# Patient Record
Sex: Male | Born: 1995 | Race: White | Hispanic: No | Marital: Single | Smoking: Never smoker
Health system: Southern US, Community
[De-identification: ages and names within clinical notes are randomized; demographics above are authoritative.]

## PROBLEM LIST (undated history)

## (undated) DIAGNOSIS — J019 Acute sinusitis, unspecified: Secondary | ICD-10-CM

## (undated) DIAGNOSIS — Z0000 Encounter for general adult medical examination without abnormal findings: Secondary | ICD-10-CM

## (undated) DIAGNOSIS — L0591 Pilonidal cyst without abscess: Secondary | ICD-10-CM

## (undated) DIAGNOSIS — S060X9A Concussion with loss of consciousness of unspecified duration, initial encounter: Secondary | ICD-10-CM

## (undated) HISTORY — DX: Concussion with loss of consciousness of unspecified duration, initial encounter: S06.0X9A

## (undated) HISTORY — PX: WISDOM TOOTH EXTRACTION: SHX21

## (undated) HISTORY — DX: Pilonidal cyst without abscess: L05.91

---
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
## 2013-01-07 HISTORY — PX: PILONIDAL CYST EXCISION: SHX744

## 2013-04-24 ENCOUNTER — Ambulatory Visit: Payer: Self-pay | Admitting: Family Medicine

## 2013-05-17 ENCOUNTER — Ambulatory Visit: Payer: Self-pay | Admitting: Family Medicine

## 2013-05-24 ENCOUNTER — Encounter: Payer: Self-pay | Admitting: General Surgery

## 2013-05-24 ENCOUNTER — Ambulatory Visit (INDEPENDENT_AMBULATORY_CARE_PROVIDER_SITE_OTHER): Payer: 59 | Admitting: General Surgery

## 2013-05-24 VITALS — BP 120/80 | Ht 77.0 in | Wt 235.0 lb

## 2013-05-24 DIAGNOSIS — L0501 Pilonidal cyst with abscess: Secondary | ICD-10-CM

## 2013-05-24 DIAGNOSIS — L0591 Pilonidal cyst without abscess: Secondary | ICD-10-CM

## 2013-05-24 NOTE — Progress Notes (Signed)
Patient ID: Jake Dixon, male   DOB: February 03, 1996, 17 y.o.   MRN: [id number]  Chief Complaint  Patient presents with  . Other    pilonidal cyst    HPI Jake Dixon is a 17 y.o. male here today for an pilonidal cyst. Patient states it has been there for about about 1 year. Patient had it drained in November 2013 and surgery in March  2014. Patient states there is an new one under the old one. 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  HPI  Past Medical History  Diagnosis Date  . Pilonidal cyst 2014  . Concussion 2012     Past Surgical History  Procedure Laterality Date  . Wisdom tooth extraction    . Pilonidal cyst drainage  09/2012  . Pilonidal cyst excision  01/2013    No family history on file.  Social History History  Substance Use Topics  . Smoking status: Never Smoker   . Smokeless tobacco: Never Used  . Alcohol Use: No    No Known Allergies  No current outpatient prescriptions on file.   No current facility-administered medications for this visit.    Review of Systems Review of Systems  Constitutional: Negative.   Respiratory: Negative.   Cardiovascular: Negative.     Blood pressure 120/80, height 6\' 5"  (1.956 m), weight 235 lb (106.595 kg).  Physical Exam Physical Exam  Constitutional: He is oriented to person, place, and time. He appears well-developed and well-nourished.  Cardiovascular: Normal rate, regular rhythm and normal heart sounds.   Pulmonary/Chest: Breath sounds normal.  Genitourinary:  Well healed scar in the midportion of the natal cleft. Fairly dense scarring here. A second incision to the right of the midline and superior to the natal cleft lesion is noted from his spring 2014 exploration. There is a 0.5 cm opening below the surgical site. No erythema is identified.  Neurological: He is alert and oriented to person, place, and time.  Skin: Skin is warm and dry.     Data Reviewed No data to review.  Assessment    Recurrent pilonidal abscess.    Plan    There appears to be fairly wide drainage and no evidence of loculated purulence. He'll likely require repeat excision. He is minimally symptomatic at present, and with his workouts for football, he may decline surgery at this time and wait until he returns home for Christmas break. He has been encouraged to get clippers to remove the generous layer of hair from the area around the natal cleft to help minimize local irritation.       Jake Dixon 05/24/2013, 10:52 PM

## 2014-01-03 ENCOUNTER — Ambulatory Visit: Payer: Self-pay

## 2014-01-03 ENCOUNTER — Other Ambulatory Visit: Payer: Self-pay

## 2014-04-26 ENCOUNTER — Ambulatory Visit: Payer: Self-pay | Admitting: Family Medicine

## 2014-07-23 ENCOUNTER — Ambulatory Visit: Payer: Self-pay | Admitting: Family Medicine

## 2015-02-08 ENCOUNTER — Ambulatory Visit: Admit: 2015-02-08 | Disposition: A | Discharge status: Home / Self Care | Payer: Self-pay | Admitting: Family Medicine

## 2015-06-19 ENCOUNTER — Ambulatory Visit: Payer: 59

## 2015-06-19 ENCOUNTER — Ambulatory Visit
Admission: EM | Admit: 2015-06-19 | Discharge: 2015-06-19 | Disposition: A | Discharge status: Home / Self Care | Payer: 59 | Attending: Family Medicine | Admitting: Family Medicine

## 2015-06-19 DIAGNOSIS — T672XXS Heat cramp, sequela: Secondary | ICD-10-CM

## 2015-06-19 DIAGNOSIS — R252 Cramp and spasm: Secondary | ICD-10-CM | POA: Insufficient documentation

## 2015-06-19 DIAGNOSIS — T672XXD Heat cramp, subsequent encounter: Secondary | ICD-10-CM

## 2015-06-19 LAB — CBC WITH DIFFERENTIAL/PLATELET
Basophils Absolute: 0 10*3/uL (ref 0–0.1)
Basophils Relative: 1 %
Eosinophils Absolute: 0.1 10*3/uL (ref 0–0.7)
Eosinophils Relative: 2 %
HCT: 46.8 % (ref 40.0–52.0)
Hemoglobin: 16.2 g/dL (ref 13.0–18.0)
LYMPHS PCT: 33 %
Lymphs Abs: 2.6 10*3/uL (ref 1.0–3.6)
MCH: 28.6 pg (ref 26.0–34.0)
MCHC: 34.7 g/dL (ref 32.0–36.0)
MCV: 82.5 fL (ref 80.0–100.0)
Monocytes Absolute: 0.7 10*3/uL (ref 0.2–1.0)
Monocytes Relative: 9 %
NEUTROS PCT: 55 %
Neutro Abs: 4.4 10*3/uL (ref 1.4–6.5)
Platelets: 184 10*3/uL (ref 150–440)
RBC: 5.68 MIL/uL (ref 4.40–5.90)
RDW: 12.6 % (ref 11.5–14.5)
WBC: 7.9 10*3/uL (ref 3.8–10.6)

## 2015-06-19 LAB — COMPREHENSIVE METABOLIC PANEL
ALBUMIN: 5.4 g/dL — AB (ref 3.5–5.0)
ALT: 54 U/L (ref 17–63)
AST: 59 U/L — AB (ref 15–41)
Alkaline Phosphatase: 67 U/L (ref 38–126)
Anion gap: 11 (ref 5–15)
BILIRUBIN TOTAL: 1 mg/dL (ref 0.3–1.2)
BUN: 23 mg/dL — ABNORMAL HIGH (ref 6–20)
CALCIUM: 9.9 mg/dL (ref 8.9–10.3)
CO2: 24 mmol/L (ref 22–32)
Chloride: 100 mmol/L — ABNORMAL LOW (ref 101–111)
Creatinine, Ser: 1.12 mg/dL (ref 0.61–1.24)
GFR calc Af Amer: >60 mL/min (ref >60)
GFR calc non Af Amer: >60 mL/min (ref >60)
Glucose, Bld: 99 mg/dL (ref 65–99)
Potassium: 4.3 mmol/L (ref 3.5–5.1)
SODIUM: 135 mmol/L (ref 135–145)
Total Protein: 8.8 g/dL — ABNORMAL HIGH (ref 6.5–8.1)

## 2015-06-19 LAB — URINALYSIS COMPLETE WITH MICROSCOPIC (ARMC ONLY)
BILIRUBIN URINE: NEGATIVE
Glucose, UA: NEGATIVE mg/dL
KETONES UR: NEGATIVE mg/dL
Leukocytes, UA: NEGATIVE
Nitrite: NEGATIVE
PROTEIN: NEGATIVE mg/dL
RBC / HPF: NONE SEEN RBC/hpf (ref <3)
SPECIFIC GRAVITY, URINE: 1.01 (ref 1.005–1.030)
pH: 6.5 (ref 5.0–8.0)

## 2015-06-19 LAB — MONONUCLEOSIS SCREEN: MONO SCREEN: NEGATIVE

## 2015-06-19 NOTE — ED Provider Notes (Signed)
Patient presents today for follow-up regarding heat-related full body cramps at football practice yesterday. Patient was given 1 L of normal saline IV in the training room by me. Patient states that the cramps have resolved since getting the IV fluids. He has however had decreased appetite over the last week or 2 and has also had a mild productive cough and fatigue. He had noticed some night sweats for a few nights but did not have them yesterday. He denies any headache, chest pain, shortness of breath, abdominal pain, diarrhea, vomiting, rash. He denies any history of any tick bites. He denies any new medication use or supplement use. He denies any alcohol or illicit drug use. He denies any history of heat related issues in the past playing sports.  ROS: Negative except mentioned above. Vitals as per Epic  GENERAL: NAD HEENT: no pharyngeal erythema, no exudate, no erythema of TMs, no cervical LAD RESP: CTA B CARD: RRR ABD: +BS, NT/ND, no rebound or guarding NEURO: CN II-XII grossly intact   A/P: Heat Related Cramps/Exhaustion-patient seems to be improving from this, has slight elevation of creatinine which should continue to normalize with oral hydration, chest x-ray shows no acute process, CBC normal, mono negative, there is slight elevation of total protein albumin and liver function tests which could represent possible viral process resolving, would like to repeat this in 1 week to see if it has normalized. Patient is to follow-up with me at [facility] later this week. He is not to practice today and is to check in with his trainer tomorrow to see if his weight is maintained and to see if he is well enough to practice. Discussed the dangers of heat related illness. Patient addresses understanding of above.   Jolene Provost, MD 06/19/15 [phone]

## 2015-06-19 NOTE — ED Notes (Signed)
Pt states "I am student at [employer], yesterday I developed a full body cramp. The team doctor gave me IV fluids, the cramps have resolved, but I feel weak and no appetite. I also have a productive cough. No sore throat. I have had night sweats."

## 2015-08-30 DIAGNOSIS — F320 Major depressive disorder, single episode, mild: Secondary | ICD-10-CM | POA: Diagnosis not present

## 2015-09-20 DIAGNOSIS — F329 Major depressive disorder, single episode, unspecified: Secondary | ICD-10-CM | POA: Diagnosis not present

## 2015-09-20 DIAGNOSIS — Z7141 Alcohol abuse counseling and surveillance of alcoholic: Secondary | ICD-10-CM | POA: Diagnosis not present

## 2015-09-20 DIAGNOSIS — S060X9A Concussion with loss of consciousness of unspecified duration, initial encounter: Secondary | ICD-10-CM | POA: Diagnosis not present

## 2016-03-09 ENCOUNTER — Other Ambulatory Visit: Payer: Self-pay | Admitting: Family Medicine

## 2016-03-09 ENCOUNTER — Ambulatory Visit
Admission: RE | Admit: 2016-03-09 | Discharge: 2016-03-09 | Disposition: A | Discharge status: Home / Self Care | Payer: 59 | Source: Ambulatory Visit | Attending: Family Medicine | Admitting: Family Medicine

## 2016-03-09 DIAGNOSIS — M25561 Pain in right knee: Secondary | ICD-10-CM | POA: Diagnosis present

## 2016-03-09 DIAGNOSIS — M25461 Effusion, right knee: Secondary | ICD-10-CM

## 2016-03-09 DIAGNOSIS — Y9361 Activity, american tackle football: Secondary | ICD-10-CM | POA: Diagnosis not present

## 2016-03-12 ENCOUNTER — Ambulatory Visit
Admission: RE | Admit: 2016-03-12 | Discharge: 2016-03-12 | Disposition: A | Discharge status: Home / Self Care | Payer: 59 | Source: Ambulatory Visit | Attending: Family Medicine | Admitting: Family Medicine

## 2016-03-12 ENCOUNTER — Other Ambulatory Visit: Payer: Self-pay | Admitting: Family Medicine

## 2016-03-12 DIAGNOSIS — S83411A Sprain of medial collateral ligament of right knee, initial encounter: Secondary | ICD-10-CM | POA: Diagnosis not present

## 2016-03-12 DIAGNOSIS — S8991XA Unspecified injury of right lower leg, initial encounter: Secondary | ICD-10-CM

## 2016-03-12 DIAGNOSIS — M2341 Loose body in knee, right knee: Secondary | ICD-10-CM | POA: Diagnosis not present

## 2016-03-12 DIAGNOSIS — M25461 Effusion, right knee: Secondary | ICD-10-CM | POA: Diagnosis present

## 2016-03-12 DIAGNOSIS — S83014A Lateral dislocation of right patella, initial encounter: Secondary | ICD-10-CM | POA: Diagnosis not present

## 2016-03-13 ENCOUNTER — Ambulatory Visit: Payer: 59

## 2016-04-23 ENCOUNTER — Other Ambulatory Visit: Payer: Self-pay | Admitting: Family Medicine

## 2016-04-23 ENCOUNTER — Ambulatory Visit (INDEPENDENT_AMBULATORY_CARE_PROVIDER_SITE_OTHER): Payer: 59 | Admitting: Family Medicine

## 2016-04-23 DIAGNOSIS — Z0283 Encounter for blood-alcohol and blood-drug test: Secondary | ICD-10-CM

## 2016-04-23 NOTE — Progress Notes (Signed)
Patient ID: Jake BersJohn L Dixon, male   DOB: 11/19/1995, 20 y.o.   MRN: [id number]  Patient presents today for drug screen. 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 Patient denies any substance abuse since testing positive. 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 He denies any suicidal ideations. He denies smoking cigarettes or alcohol use.  ROS: Negative except mentioned above.  Exam-deferred  A/P: Drug Screen- will send patient to [facility] for drug screen.

## 2016-04-24 LAB — 5+CRT-BUND
AMPHETAMINES, URINE: NEGATIVE ng/mL
CANNABINOID QUANT UR: NEGATIVE ng/mL
Cocaine (Metab.): NEGATIVE ng/mL
Creatinine, Urine: 352.7 mg/dL — ABNORMAL HIGH (ref 20.0–300.0)
OPIATE QUANTITATIVE URINE: NEGATIVE ng/mL
PCP QUANT UR: NEGATIVE ng/mL
pH, Urine: 5.9 (ref 4.5–8.9)

## 2016-05-18 ENCOUNTER — Ambulatory Visit (INDEPENDENT_AMBULATORY_CARE_PROVIDER_SITE_OTHER): Payer: 59 | Admitting: Family Medicine

## 2016-05-18 ENCOUNTER — Encounter: Payer: Self-pay | Admitting: Family Medicine

## 2016-05-18 VITALS — BP 121/77 | HR 76 | Temp 98.1°F | Resp 16

## 2016-05-18 DIAGNOSIS — R109 Unspecified abdominal pain: Secondary | ICD-10-CM

## 2016-05-18 DIAGNOSIS — R197 Diarrhea, unspecified: Secondary | ICD-10-CM

## 2016-05-18 MED ORDER — DICYCLOMINE HCL 20 MG PO TABS: 20.0000 mg | ORAL_TABLET | Freq: Three times a day (TID) | ORAL | Status: AC

## 2016-05-18 NOTE — Progress Notes (Addendum)
Patient ID: Jake Dixon, male   DOB: 05/01/1996, 20 y.o.   MRN: [id number]  Patient presents today with symptoms of abdominal cramping and diarrhea. Patient states the symptoms started on Saturday. He states that he had biscuit Bell in the morning and a BBQ pizza in the afternoon. He woke up in the [place] night with the symptoms. He denies any nausea or vomiting or any chest pain or shortness of breath. He denies any fever. He took Pepto-Bismol which helped some but then symptoms came back. He denies any bloody diarrhea or any urinary symptoms. He denies any alcohol use or any illicit drug use. Patient denied any recent antibiotic use.  ROS: Negative except mentioned above.  Vitals as per Epic GENERAL: NAD HEENT: no pharyngeal erythema, no exudate, no erythema of TMs, no cervical LAD RESP: CTA B CARD: RRR ABD: overactive BS, NT, no rebound or guarding, no flank tenderness NEURO: CN II-XII grossly intact   A/P: Gastroenteritis, Abdominal Cramping- rest, hydration, no athletic activity until diarrhea resolves, will treat with Bentyl when necessary, BRAT diet discussed, seek medical attention if symptoms persist or worsen as discussed.

## 2018-03-29 IMAGING — CR DG KNEE COMPLETE 4+V*R*
1 series · 4 of 4 positions shown · non-contrast
Comparison: None.

CLINICAL DATA: Football injury from 2 days ago with persistent knee
pain, initial encounter

EXAM:
RIGHT KNEE - COMPLETE 4+ VIEW

[Series 1: view not recorded · 0.14mm/px · 4 of 4 slices shown]
[im 1/4]
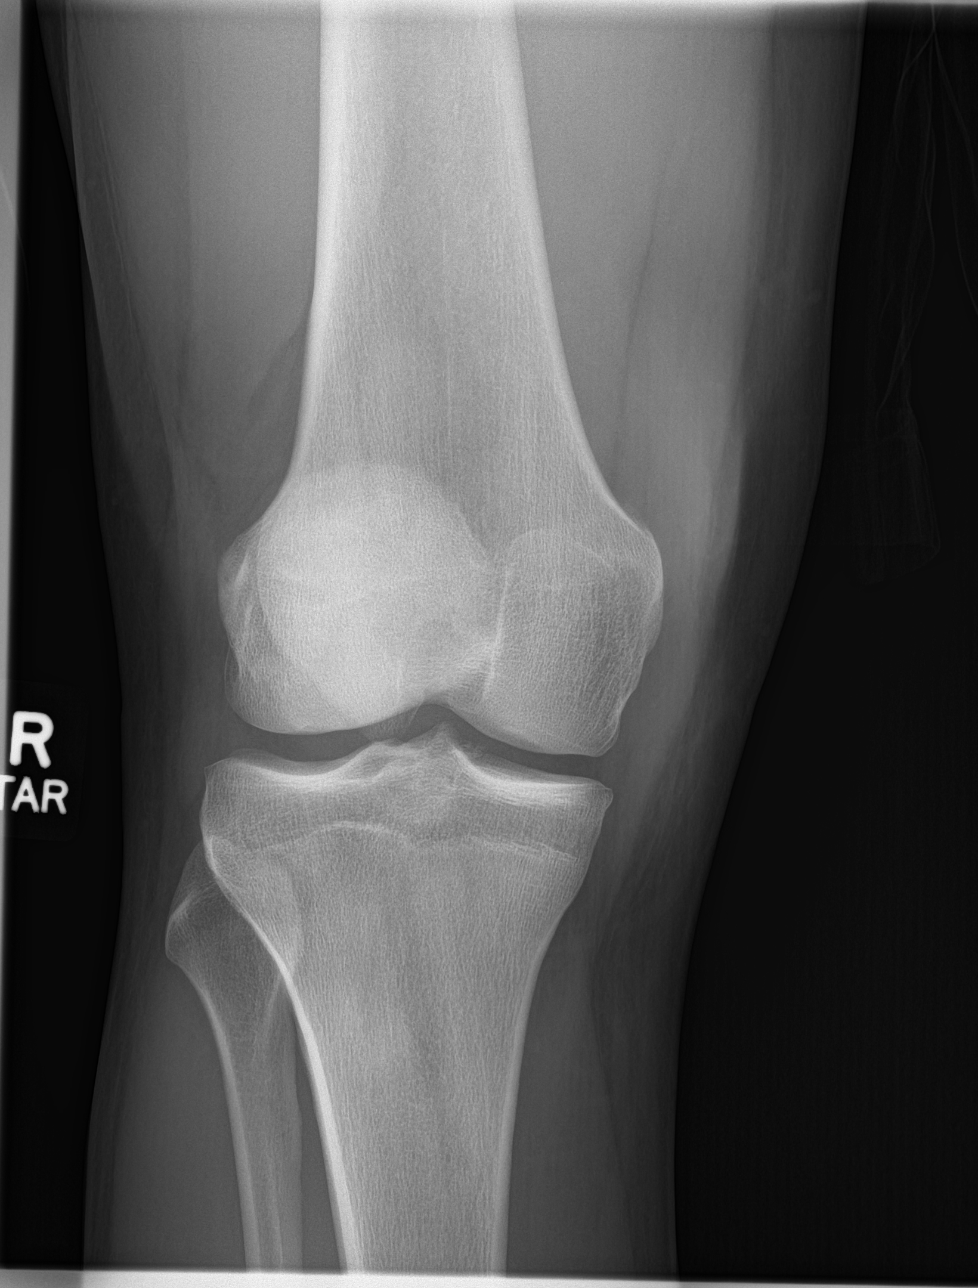
[im 2/4]
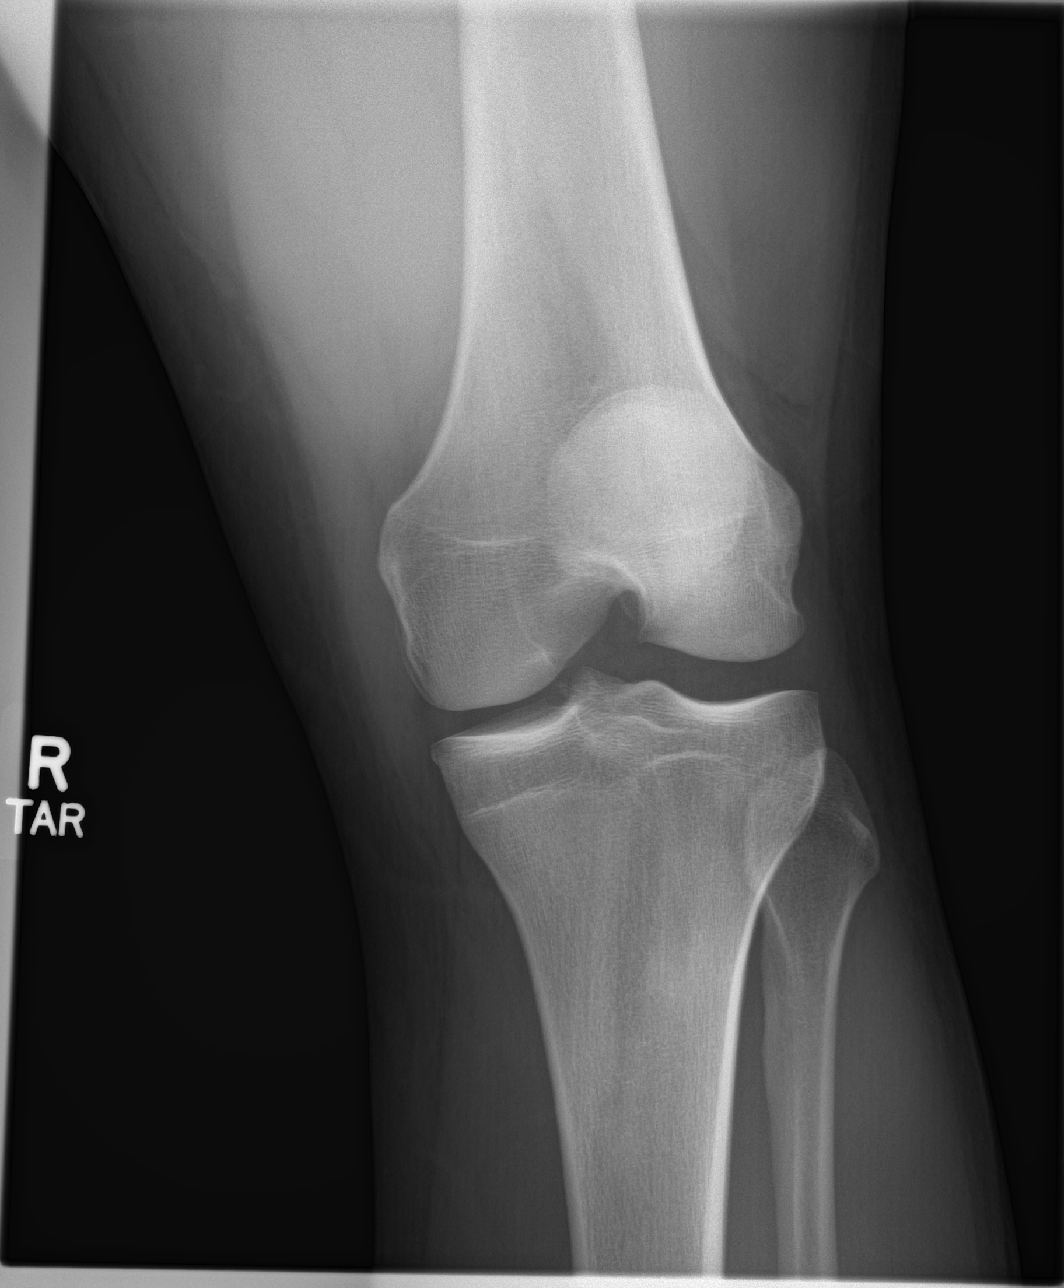
[im 3/4]
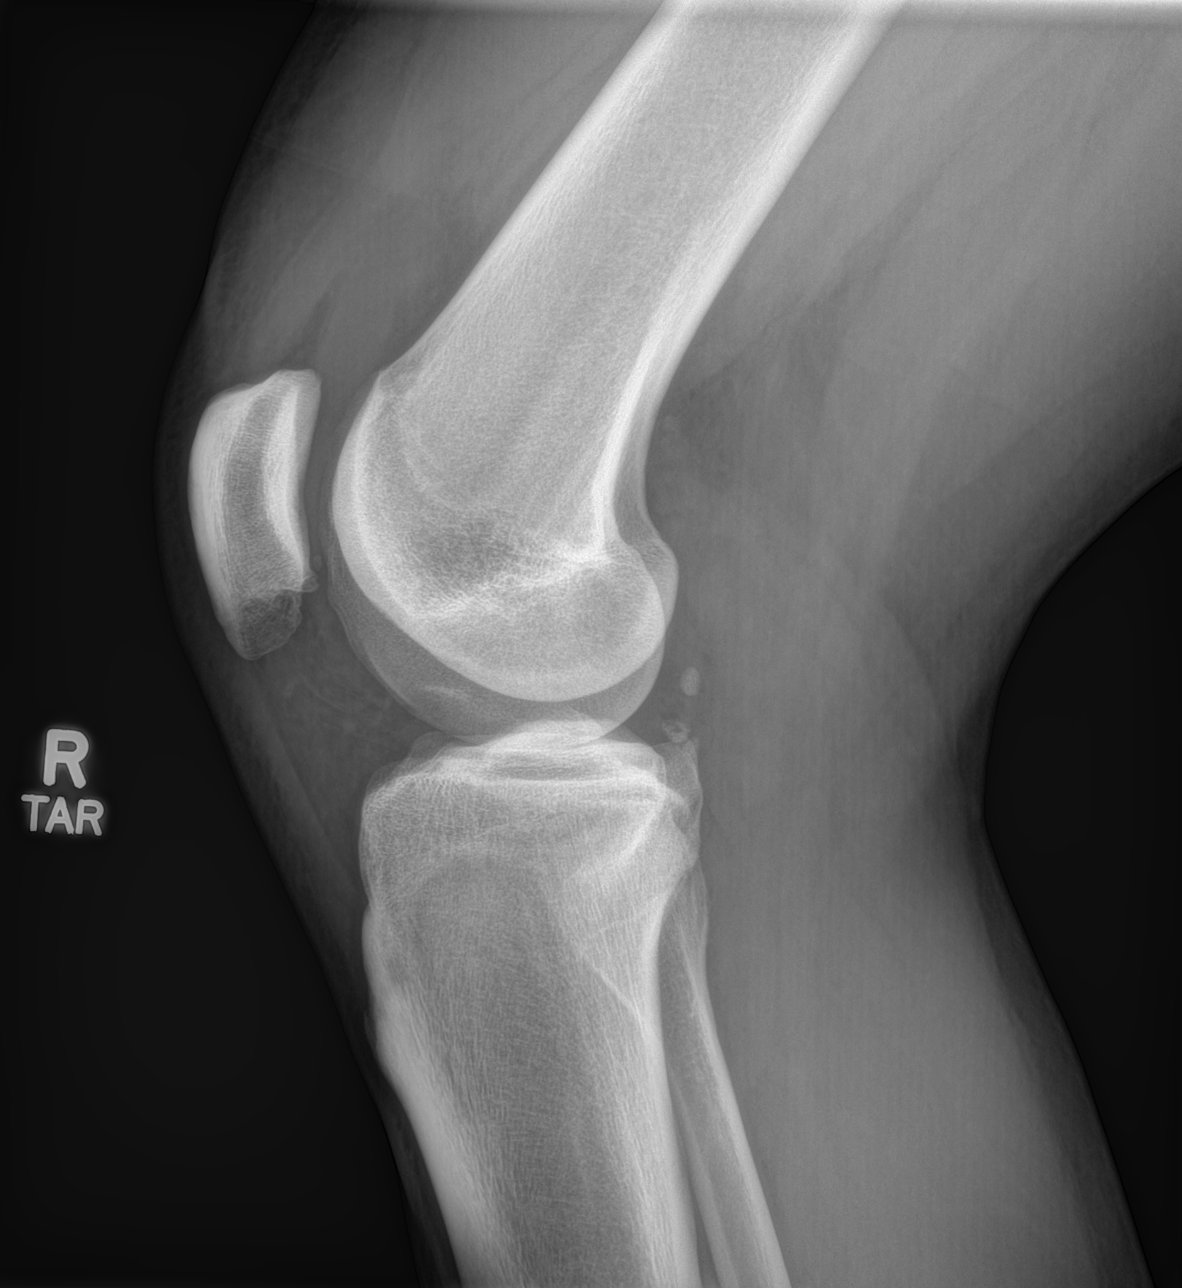
[im 4/4]
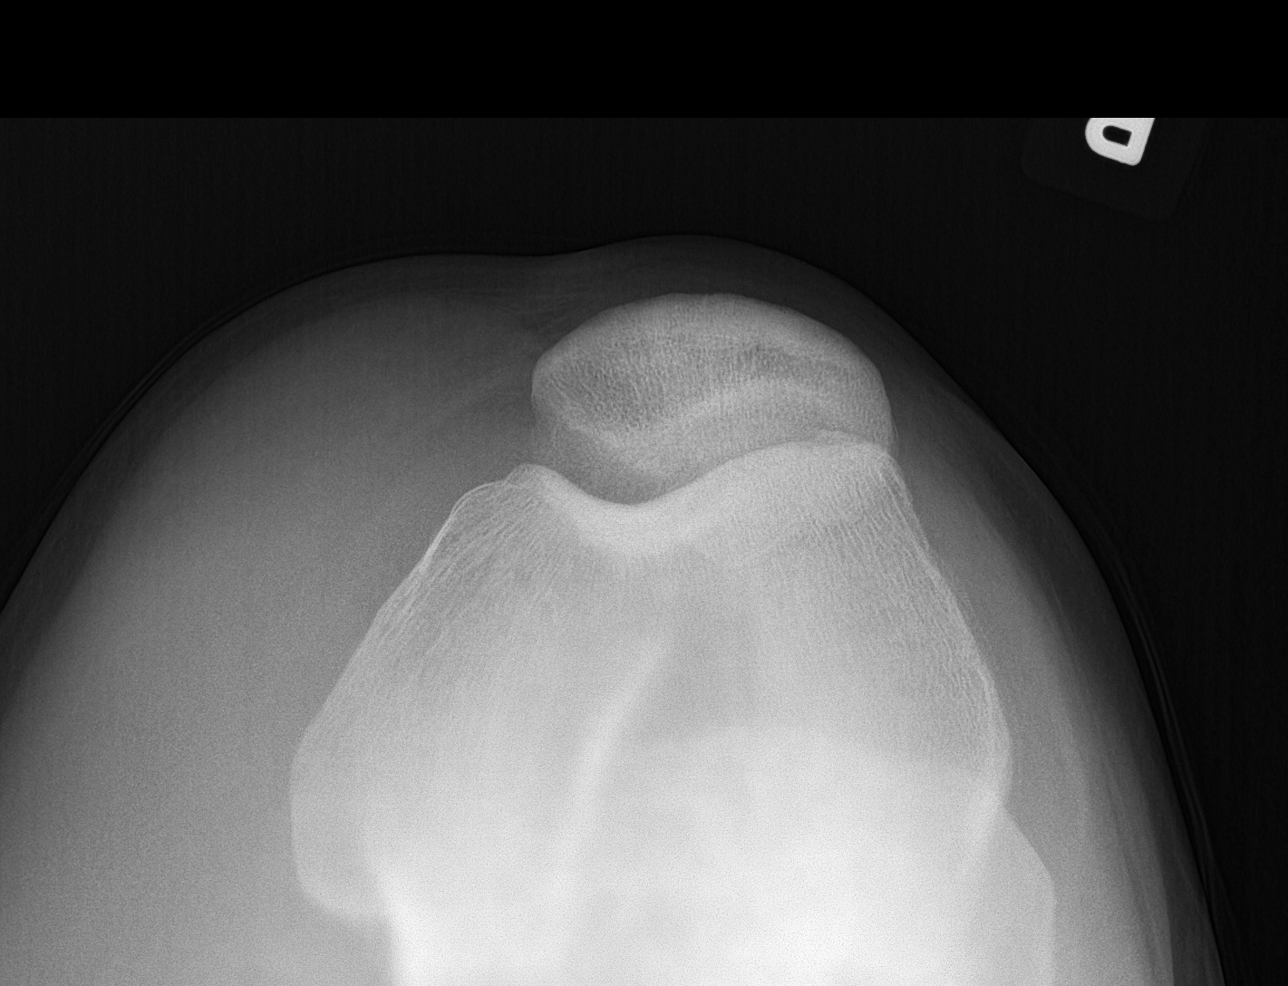

[4 of 4 positions shown; findings below may reference images not displayed]

FINDINGS: Small joint effusion is noted. There is irregularity identified
adjacent to the tibial spines medially. No other fracture or
dislocation is noted. Some soft tissue thickening is noted medially
in the expected course of the medial collateral ligament.
IMPRESSION: Irregularity in the medial aspect of the tibial spine as well as
some thickening of the medial collateral ligament. This suggests the
possibility of avulsion at the insertion of the anterior cruciate
ligament. MRI is recommended for further evaluation.

## 2021-12-30 ENCOUNTER — Ambulatory Visit
Admit: 2021-12-30 | Discharge: 2021-12-30 | Payer: PRIVATE HEALTH INSURANCE | Attending: Family Medicine | Primary: Family Medicine

## 2021-12-30 DIAGNOSIS — Z0000 Encounter for general adult medical examination without abnormal findings: Secondary | ICD-10-CM

## 2021-12-30 NOTE — Progress Notes (Signed)
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 He is healthy and active. His only concern is some mild diminished libido from previous. No other major symptoms. Otherwise, feeling well.    PHQ Scores 12/30/2021   PHQ2 Score 0   PHQ9 Score 0     Interpretation of Total Score Depression Severity: 1-4 = Minimal depression, 5-9 = Mild depression, 10-14 = Moderate depression, 15-19 = Moderately severe depression, 20-27 = Severe depression     Prior to Visit Medications    Not on File       History reviewed. No pertinent past medical history.     Social History     Tobacco Use    Smoking status: Never    Smokeless tobacco: Never   Substance Use Topics    Alcohol use: Yes     Comment: social    Drug use: Not Currently     Comment: THC pens        History reviewed. No pertinent surgical history.     No Known Allergies     Family History   Problem Relation Age of Onset    Diabetes Brother         Patient's past medical history, surgical history, family history, medications, and allergies  were all reviewed and updated as appropriate today.    Review of Systems   Constitutional:  Negative for fever.   HENT:  Negative for ear pain and sore throat.    Respiratory:  Negative for cough and shortness of breath.    Cardiovascular:  Negative for chest pain.   Gastrointestinal:  Negative for abdominal pain and nausea.   Genitourinary:         +diminished libido at times   Skin:  Negative for rash.   Neurological:  Negative for dizziness and headaches.   Hematological:  Negative for adenopathy.   Psychiatric/Behavioral:  Negative for dysphoric mood and sleep disturbance.      BP 126/71 (Cuff Size: Large Adult)    Pulse 58    Temp 98.3 ??F  (36.8 ??C) (Oral)    Ht 6' 5.5" (1.969 m)    Wt 253 lb 3.2 oz (114.9 kg)    SpO2 98% Comment: ROOM AIR   BMI 29.64 kg/m??      Physical Exam  Vitals reviewed.   Constitutional:       General: He is not in acute distress.  HENT:      Head: Normocephalic.      Nose: Nose normal.      Mouth/Throat:      Mouth: Mucous membranes are moist.   Eyes:      Extraocular Movements: Extraocular movements intact.      Pupils: Pupils are equal, round, and reactive to light.   Cardiovascular:      Rate and Rhythm: Normal rate and regular rhythm.      Heart sounds: No murmur heard.  Pulmonary:      Effort: Pulmonary effort is normal.      Breath sounds: Normal breath sounds. No  wheezing.   Abdominal:      General: Bowel sounds are normal.      Palpations: Abdomen is soft.      Tenderness: There is no abdominal tenderness.   Genitourinary:     Comments: deferred  Musculoskeletal:         General: No swelling or deformity.      Cervical back: Neck supple.   Lymphadenopathy:      Cervical: No cervical adenopathy.   Skin:     General: Skin is warm and dry.      Findings: No rash.   Neurological:      Mental Status: He is alert and oriented to person, place, and time.      Cranial Nerves: No cranial nerve deficit.   Psychiatric:         Mood and Affect: Mood normal.         Behavior: Behavior is cooperative.       Assessment:  Encounter Diagnoses   Name Primary?    Annual physical exam Yes    Need for hepatitis C screening test     Diminished libido - first reported 12/2021     Family history of diabetes mellitus        Plan:  1. Annual physical exam  General wellness exam. Reviewed chart for past hx and updated today. Counseled on age appropriate health guidance and discussed screening recommendations. Vaccinations reviewed and discussed. All questions answered  - CBC with Auto Differential; Future  - Comprehensive Metabolic Panel, Fasting; Future  - Lipid, Fasting; Future  - Hemoglobin A1C; Future  - TSH with Reflex; Future    2. Need  for hepatitis C screening test  Per recommendations issued by the [place] for Disease Control and Prevention (CDC) in 2020 adults ?28 years of age be screened at least once for chronic HCV infection. Pt agreeable. No know risk of exposure in past or recent per pt.  - Hepatitis C Antibody; Future    3. Diminished libido - first reported 12/2021  Mild in nature at this time. Will encourage regular exercise, focus on good mental health practices and moderation of ETOH. Monitor.     4. Family history of diabetes mellitus  - Hemoglobin A1C; Future      Return in about 1 year (around 12/30/2022) for Annual Physical.               Natividad Brood, DO     Please note that this chart was generated using dragon dictation software.  Although every effort was made to ensure the accuracy of this automated transcription, some errors in transcription may have occurred.

## 2021-12-30 NOTE — Patient Instructions (Signed)
[facility] Locations - No appointment necessary.  @ indicates the location is open Saturdays in addition to Monday through Friday.   Call your preferred location for test preparation, business hours and other information you need.   [facility] accepts all insurances.  CENTRAL  EAST  WEST     @ [place]   [address] [place] [address]    Ph: [phone]  [facility]   [address] [place] [address]    Ph: [phone]   @ [place]   [address]    [place] [address]    Ph: [phone]     [facility]   [address] [place] [address]    Ph: [phone]  @ [place]   [address] [place] [address]   Ph: [phone]  @ [facility]   [address] [place] [address]    Ph: [phone]      [facility]   [address] [place] [address]   Ph: [phone]    NORTH    @ [place]   [address] [address]    Ph: [phone]  [facility]   [address] [place] [address]   Ph: [phone]  [place]   [address]   [place], [address]    PH: [phone]    [facility]   [address] [place] [address]    Ph: [phone]   [facility]   [address] [place] [address]   Ph: [phone]  [facility]   [address] [place] [address]    Ph: [phone]

## 2022-01-03 ENCOUNTER — Encounter

## 2022-01-03 LAB — COMPREHENSIVE METABOLIC PANEL, FASTING
ALT: 10 U/L (ref 10–40)
AST: 19 U/L (ref 15–37)
Albumin/Globulin Ratio: 2.7 — ABNORMAL HIGH (ref 1.1–2.2)
Albumin: 4.8 g/dL (ref 3.4–5.0)
Alkaline Phosphatase: 62 U/L (ref 40–129)
Anion Gap: 11 (ref 3–16)
BUN: 12 mg/dL (ref 7–20)
CO2: 26 mmol/L (ref 21–32)
Calcium: 9.7 mg/dL (ref 8.3–10.6)
Chloride: 103 mmol/L (ref 99–110)
Creatinine: 0.8 mg/dL — ABNORMAL LOW (ref 0.9–1.3)
Est, Glom Filt Rate: >60 (ref >60)
Glucose, Fasting: 104 mg/dL — ABNORMAL HIGH (ref 70–99)
Potassium: 4.9 mmol/L (ref 3.5–5.1)
Sodium: 140 mmol/L (ref 136–145)
Total Bilirubin: 0.4 mg/dL (ref 0.0–1.0)
Total Protein: 6.6 g/dL (ref 6.4–8.2)

## 2022-01-03 LAB — LIPID, FASTING
Cholesterol, Fasting: 166 mg/dL (ref 0–199)
HDL: 30 mg/dL — ABNORMAL LOW (ref 40–60)
LDL Calculated: 101 mg/dL — ABNORMAL HIGH (ref <100)
Triglyceride, Fasting: 176 mg/dL — ABNORMAL HIGH (ref 0–150)
VLDL Cholesterol Calculated: 35 mg/dL

## 2022-01-03 LAB — CBC WITH AUTO DIFFERENTIAL
Basophils %: 0.4 %
Basophils Absolute: 0 10*3/uL (ref 0.0–0.2)
Eosinophils %: 2.1 %
Eosinophils Absolute: 0.1 10*3/uL (ref 0.0–0.6)
Hematocrit: 42.8 % (ref 40.5–52.5)
Hemoglobin: 14.3 g/dL (ref 13.5–17.5)
Lymphocytes %: 44.4 %
Lymphocytes Absolute: 2.2 10*3/uL (ref 1.0–5.1)
MCH: 28.7 pg (ref 26.0–34.0)
MCHC: 33.5 g/dL (ref 31.0–36.0)
MCV: 85.7 fL (ref 80.0–100.0)
MPV: 8.6 fL (ref 5.0–10.5)
Monocytes %: 10 %
Monocytes Absolute: 0.5 10*3/uL (ref 0.0–1.3)
Neutrophils %: 43.1 %
Neutrophils Absolute: 2.1 10*3/uL (ref 1.7–7.7)
Platelets: 177 10*3/uL (ref 135–450)
RBC: 4.99 M/uL (ref 4.20–5.90)
RDW: 12.9 % (ref 12.4–15.4)
WBC: 5 10*3/uL (ref 4.0–11.0)

## 2022-01-03 LAB — HEPATITIS C ANTIBODY: Hep C Ab Interp: NONREACTIVE

## 2022-01-03 LAB — TSH WITH REFLEX: TSH: 1.3 u[IU]/mL (ref 0.27–4.20)

## 2022-01-04 LAB — HEMOGLOBIN A1C
Hemoglobin A1C: 4.9 %
eAG: 93.9 mg/dL

## 2022-01-05 NOTE — Other (Signed)
[place], I have reviewed labs and everything is completely normal.

## 2023-01-12 ENCOUNTER — Ambulatory Visit
Admit: 2023-01-12 | Discharge: 2023-01-12 | Payer: BLUE CROSS/BLUE SHIELD | Attending: Family Medicine | Primary: Family Medicine

## 2023-01-12 DIAGNOSIS — J069 Acute upper respiratory infection, unspecified: Secondary | ICD-10-CM

## 2023-01-12 MED ORDER — PREDNISONE 20 MG PO TABS
20 | ORAL_TABLET | Freq: Every day | ORAL | 0 refills | Status: AC
Start: 2023-01-12 — End: 2023-01-17

## 2023-01-12 NOTE — Patient Instructions (Signed)
[facility] Locations - No appointment necessary.  ? indicates the location is open Saturdays in addition to Monday through Friday.   Call your preferred location for test preparation, business hours and other information you need.   [facility] accepts all insurances.  CENTRAL  EAST  WEST    ? [place]   [address]   [address]   [place] [address]    Ph: [phone]  [facility]   [address] [place] [address]    Ph: [phone]   ? [place]   [address]    [place] [address]    Ph: [phone]     [facility]   [address] [place] [address]    Ph: [phone] ? [place]   [address] [place] [address]   Ph: [phone]  ? [facility]   [address]  [place] [address]    Ph: [phone]      [facility]   [address] [place] [address]   Ph: [phone]    NORTH    ? [place]   [address] [address]    Ph: [phone]  [place] [facility]   [address] [place] [address]   Ph: [phone]  [place]   [address]   [place], [address]    PH: [phone]    [facility]   [address] [place] [address]    Ph: [phone]  [facility]  [address] [place] [address]  Ph: [phone]  [facility]   [address] [place] [address]    Ph: [phone]

## 2023-01-12 NOTE — Progress Notes (Unsigned)
[facility] PHYSICIAN PRACTICES  [facility]  [address]  [place] [address]  Dept: [phone]  Dept Fax: [phone]     01/12/2023      Benjamin Mckee   [birth year]     Chief Complaint   Patient presents with    Sinus Problem     Sinus issues x 5 days. Yesterday it worsened   home COVID test today  negative       HPI  Pt comes in today for eval acute illness. Friday started with sore throat and voice changes. Now a lot of drainage, sinus pressure and ear changes. No fevers. No SOB, mild cough and mucous. Has been using Nyquil/Dayquil. Negative home COVID.        01/12/2023    12:15 PM 12/30/2021     3:16 PM   PHQ Scores   PHQ2 Score 0 0   PHQ9 Score 0 0     Interpretation of Total Score Depression Severity: 1-4 = Minimal depression, 5-9 = Mild depression, 10-14 = Moderate depression, 15-19 = Moderately severe depression, 20-27 = Severe depression     Prior to Visit Medications    Not on File       History reviewed. No pertinent past medical history.     Social History     Tobacco Use    Smoking status: Never    Smokeless tobacco: Never   Substance Use Topics    Alcohol use: Yes     Comment: social    Drug use: Not Currently     Comment: THC pens        History reviewed. No pertinent surgical history.     No Known Allergies     Family History   Problem Relation Age of Onset    Diabetes Brother         Patient's past medical history, surgical history, family history, medications, and allergies  were all reviewed and updated as appropriate today.    Review of Systems    BP 124/71 (Cuff Size: Large Adult)   Pulse 73   Temp 98.2 F (36.8 C) (Oral)   Wt 118.4 kg (261 lb)   SpO2 98% Comment: room air  BMI 30.55 kg/m      Physical Exam    Assessment:  Encounter Diagnosis   Name Primary?    Acute URI Yes       Plan:  1. Acute URI  ***  - predniSONE (DELTASONE) 20 MG tablet; Take 2 tablets by mouth daily for 5 days  Dispense: 10 tablet; Refill: 0      No follow-ups on file.               Natividad Brood, DO     Please note that this chart was generated using dragon dictation software.  Although every effort was made to ensure the accuracy of this automated transcription, some errors in transcription may have occurred.

## 2023-01-20 ENCOUNTER — Encounter

## 2023-01-20 MED ORDER — AMOXICILLIN-POT CLAVULANATE 875-125 MG PO TABS
875-125 | ORAL_TABLET | Freq: Two times a day (BID) | ORAL | 0 refills | Status: AC
Start: 2023-01-20 — End: 2023-01-30

## 2023-01-20 NOTE — Telephone Encounter (Signed)
Patient was seen for URI on 01/12/23. Patient has taken medication. Routing for recommendation.

## 2023-05-03 ENCOUNTER — Ambulatory Visit
Admit: 2023-05-03 | Discharge: 2023-05-03 | Payer: BLUE CROSS/BLUE SHIELD | Attending: Orthopaedic Surgery | Primary: Family Medicine

## 2023-05-03 ENCOUNTER — Ambulatory Visit: Admit: 2023-05-03 | Payer: BLUE CROSS/BLUE SHIELD | Primary: Family Medicine

## 2023-05-03 DIAGNOSIS — Z9889 Other specified postprocedural states: Secondary | ICD-10-CM

## 2023-05-03 NOTE — Progress Notes (Signed)
Dr Cathleen Fears      Date /Time 05/03/2023       10:01 AM EDT  Name Benjamin Mckee             02/18/1996   Location  [facility]  MRN [id number]                Chief Complaint   Patient presents with    Knee Pain     NP R Knee         History of Present Illness  Benjamin Mckee is a 27 y.o. male who presents with right knee pain.  Past history of a right arthroscopy, loose body removal and abrasion in 2017. He made note of a chip/damage with his patella.  He presents today with dull pain while working out with knee pressure based work outs specifically with squatting, down hill hiking, noticing cracking/crunching when playing basketball.   He is here for further treatment options and opinion.    Sent in consultation by Wetzel Bjornstad, DO.      Past History  No past medical history on file.  No past surgical history on file.  Social History     Tobacco Use    Smoking status: Never    Smokeless tobacco: Never   Substance Use Topics    Alcohol use: Yes     Comment: social      No current outpatient medications on file prior to visit.     No current facility-administered medications on file prior to visit.        ASCVD 10-YEAR RISK SCORE  The ASCVD Risk score (Arnett DK, et al., 2019) failed to calculate for the following reasons:    The 2019 ASCVD risk score is only valid for ages 63 to 65     Review of Systems  10-point ROS is negative other than HPI.    Physical Exam  Ht 1.969 m (6' 5.5")   Wt 118.4 kg (261 lb)   BMI 30.55 kg/m      Constitutional:       General: He is not in acute distress.     Appearance: Normal appearance.   Cardiovascular:      Rate and Rhythm: Normal rate and regular rhythm.      Pulses: Normal pulses.   Pulmonary:      Effort: Pulmonary effort is normal. No respiratory distress.   Neurological:      Mental Status: He is alert and oriented to person, place, and time. Mental status is at baseline.   Skin: Mean, dry, intact.  No open sores  Lymphatics: No palpable lymph  nodes    Musculoskeletal:  Gait:  normal    R Knee:     Skin in tact without ulcerations or previous scars  Minimal Effusion  No tenderness, including with PF grind test  ROM: 0 - 130  Grossly stable to varus / valgus stress and posterior drawer   No evidence of neurovascular compromise distally  Special tests: None      Imaging  Multiple views of the right knee ordered obtained reviewed and interpreted, show Kellgren Lawrence grade 2 changes withloss of medial PF joint space          Orders Placed This Encounter   Procedures    XR KNEE RIGHT (3 VIEWS)     Standing Status:   Future     Number of Occurrences:   1     Standing Expiration Date:  05/02/2024       Assessment and Plan  Rachit was seen today for knee pain.    Diagnoses and all orders for this visit:    H/O arthroscopy of right knee  -     XR KNEE RIGHT (3 VIEWS); Future    Acute pain of right knee  -     XR KNEE RIGHT (3 VIEWS); Future        Patient was advised to limit his high weight work outs and to continue with quadriceps strengthening and low weight high rep work outs to preserve his cartilage long term.   If no relief long term from workout routine modification ; we did discuss the option of cortisone injections.   He is to follow up as needed.    I Shelle Iron am scribing for and in the presence of Dr. Cathleen Fears.  05/03/23 10:24 AM   Shelle Iron CMA      I Dr.Eily Louvier Marina Gravel, personally performed the services described in this documentation as scribed by Shelle Iron CMA in my presence, and it is both accurate and complete.     Total time spent reviewing past notes, imaging, labs, clinical exam, and treatment plan was > 35 min.

## 2023-06-08 NOTE — Telephone Encounter (Signed)
We don't provide any of that. I would give guidance that none of that is necessary BEFORE trying. If wife has GYN, that can be part of family planning discussion with them. Could ultimately offer a Urology referral if wanted.

## 2023-06-08 NOTE — Telephone Encounter (Signed)
Would you like patient to contact insurance and see a fertility clinic?

## 2023-07-01 ENCOUNTER — Other Ambulatory Visit (HOSPITAL_COMMUNITY): Payer: Self-pay

## 2023-12-07 NOTE — Other (Unsigned)
SUBJECTIVE    Benjamin Mckee is a 28 y.o. male who came into the clinic today for his annual  physical and for appropriate management.   The patient is not on any medication for any chronic medical problems.  The  patient would like to have his blood work   done since it has been a while it has been checked.  Otherwise today did not  have any other questions or concerns   and all the questions and concerns were appropriately answered.  The patient  had a wellness physical form which   was filled, signed and returned back to the patient today.    No past medical history on file.    There is no problem list on file for this patient.      No past surgical history on file.    Family History  Problem Relation Name Age of Onset   Other Neg Hx       cardiovascular      Social History    Tobacco Use   Smoking status: Never   Smokeless tobacco: Never  Substance Use Topics   Alcohol use: Yes      No current outpatient medications on file prior to visit.    No current facility-administered medications on file prior to visit.      Not on File    REVIEW OF SYSTEMS:  CONSTITUTIONAL: No weight loss, fever, chills, weakness, or fatigue.  HEENT: Eyes: No visual loss, blurred vision, double vision or yellow sclerae.  Ears, Nose, Throat: No hearing loss, sneezing, congestion, runny nose, or sore  throat.  SKIN: No rash or itching.  CARDIOVASCULAR: No chest pain, chest pressure or chest discomfort. No  palpitations or edema.  RESPIRATORY: No shortness of breath, cough, or sputum.  GASTROINTESTINAL: No anorexia, nausea, vomiting, diarrhea, or constipation. No  abdominal pain, hematochezia, or melena.  GENITOURINARY: No dysuria, urgency, frequency, hematuria.  NEUROLOGICAL: No headache, dizziness, syncope, paralysis, ataxia, numbness or  tingling in the extremities. No change in bowel or bladder control.  MUSCULOSKELETAL: No muscle pain, back pain, joint pain, or stiffness.  PSYCHIATRIC: No history of depression or  anxiety.  ENDOCRINOLOGIC: No reports of sweating, cold or heat intolerance. No polyuria   or  polydipsia.    OBJECTIVE    BP 138/76   Pulse 72   Ht 6\' 5"  (1.956 m)   Wt 257 lb (116.6 kg)   SpO2   98%   BMI 30.48 kg/m?    GENERAL: Benjamin Mckee is a 28 y.o. male who is not in any acute distress. He  was well attired and well-groomed and was speaking in full sentences.    HEENT: Head: Atraumatic, normocephalic. Eyes: Both the pupils are round, equal  and reactive to light. No squint noted. Normal red reflex is seen.  Ears: Both external auditory canals are clear. No discharge noted. Tympanic  membranes healthy. Normal light reflex is seen.  Nose: Both the nares are clear. No discharge noted. No epistaxis.  Throat: No posterior pharyngeal wall erythema. Oral mucosa moist. No enlarged  tonsils.    NECK: Supple. No cervical lymphadenopathy. No thyromegaly.    LUNGS: Normal ventilatory breath sounds are heard bilaterally. No crackles or  wheezes heard.    CARDIOVASCULAR: Normal S1, S2 heard. No murmurs heard. No JVD.    GASTROINTESTINAL: Abdomen soft, nontender. No guarding or rigidity noted. No  organomegaly noted. Normal bowel sounds were heard.    EXTREMITIES: All 4 extremities were moving fine.  Full range of motion is  present. No deformity noted. Peripheral pulses were felt. No lower extremity  edema. Neurovascular integrity maintained.    NEUROLOGICAL: The patient was alert, conscious, and cooperative. Oriented to  time, place, and person. Muscle power in all 4 limbs is 5/5. Cranial  nerves II thru XII are grossly intact. No sensory or motor loss.    PSYCHIATRIC: Appropriate mood and affect. No flights of ideas or thoughts.  Intact recent and past memory. No confusion or delirium. No suicidal or  homicidal ideations.    BACK: Normal alignment of the spine. Full range of motion is present. No  stepoff. No deformity. No spinal or paraspinal tenderness noted.      ASSESSMENT / PLAN    Diagnoses and all orders for  this visit:    Annual physical exam  -     MTHFR GENE MUTATION; Future  -     CBC WITH DIFFERENTIAL; Future  -     COMPREHENSIVE METABOLIC PANEL; Future  -     LIPID PROFILE; Future  -     TSH 3RD GEN, REFLEX FT4; Future  -     VITAMIN D 25 HYDROXY TOTAL; Future    Advise given:  Get appropriate investigations done. Will call back with the results and will  manage accordingly.  Drink plenty of fluids.  Eat five servings of fruits and vegetables every day.  Discussed importance of regular exercise and recommended starting or   continuing  a regular exercise program for good health.  Try to lose weight with diet and exercise as discussed.  The importance of annual eye exams was reviewed.  The importance of proper foot care and regularly checking feet to prevent   sores  and possibly loss of limbs was reviewed.  Appropriate handouts were given to the patient.  All the questions and concerns were appropriately answered.  Patient/ family member I caregiver verbalized understanding of patient  instructions from today's visit.  The patient was advised to follow up with me in 1 year for recheck or can call  me before if he has any other questions or concerns.

## 2023-12-09 NOTE — Other (Unsigned)
[facility]  Primary Care    VIDEO VISIT    REASON FOR VISIT:    Chief Complaint  Patient presents with   Cough    Congestion , Headache cough, chest Pain.      IMPRESSION:      ICD-10-CM  1. Acute non-recurrent pansinusitis  J01.40 amoxicillin-clavulanate   (AUGMENTIN)  875-125 mg per tablet    fluticasone propionate (FLONASE) 50 mcg/actuation nasal spray    albuterol (VENTOLIN/PROAIR/PROVENTIL HFA) 90 mcg/actuation HFA Aerosol   Inhaler        PLAN:    He was instructed to take over-the-counter NSAIDs/antitussives as needed.  He was instructed to call with any persistent or worsening symptoms.  He was instructed to followup in 2 weeks.    HISTORY OF PRESENT ILLNESS:  Benjamin Mckee is a 28 y.o. male who is evaluated today via Video Visit.  The patient called in with concerns of sinus congestion, postnasal drainage,  nasal stuffiness, cough with production of   yellowish sputum going on for past couple of days.  The patient has also been  noticing some chest tightness from past   couple of days as well.  Denies any associated fever, earache, ear discharge,  nausea, vomiting, chest pain, excessive   shortness of breath, diarrhea, dysuria or any sick contacts.  The patient has  not tested himself for COVID 19 or influenza   at this time which I did recommend him to get it checked.  Has been taking  over-the-counter medications with no significant   relief.  No other questions or concerns today and all the questions and  concerns were appropriately answered.    10 out of 14 systems reviewed, all else negative.    No past medical history on file.    No past surgical history on file.    Family History  Problem Relation Name Age of Onset   Other Neg Hx       cardiovascular    SOCIAL HISTORY:    Social History    Tobacco Use   Smoking status: Never   Smokeless tobacco: Never  Substance Use Topics   Alcohol use: Yes   Drug use: Never    ALLERGIES:    Patient has no known  allergies.    MEDICATIONS:    Current Outpatient Medications  Medication Sig Dispense Refill   albuterol (VENTOLIN/PROAIR/PROVENTIL HFA) 90 mcg/actuation HFA Aerosol   Inhaler  Take 2 Puffs by inhalation 3 times daily as needed for Wheezing. 1 Each 2   amoxicillin-clavulanate (AUGMENTIN) 875-125 mg per tablet Take 1 Tablet by  mouth 2 times daily. 14 Tablet 0   fluticasone propionate (FLONASE) 50 mcg/actuation nasal spray Spray 2 Sprays  into nose daily. 1 Each 2    No current facility-administered medications for this visit.    EXAM:    Vitals:There were no vitals taken for this visit.    GENERAL: Benjamin Mckee is a 29 y.o. male who is not in any acute distress. He  was well attired and well-groomed and was speaking in full sentences.    LUNGS:  Patient not in any acute respiratory distress.  No accessory muscles   of  respirations used.  No audible wheezes heard.    EXTREMITIES: All 4 extremities were moving fine. Full range of motion is  present. No deformity noted.      Benjamin Mckee is a 28 y.o. male  who is physically located at his home  residence, and was  seen today via a real-time Video Visit through [employer], from  my [facility] Office location.  He was evaluated for cough and cold and a summary of   my  discussion, diagnosis, and plan are documented above.    Note - This report was transcribed using voice-recognition software. Every  effort was made to ensure accuracy; however, inadvertent computerized  transcription errors may be present.      Electronically Signed By:      Barbaraann Cao, MD  12/09/2023 9:39 AM

## 2023-12-21 LAB — CBC WITH AUTO DIFFERENTIAL
Hematocrit: 43.6 % (ref 40.0–51.0)
Hemoglobin: 14.8 g/dL (ref 13.2–17.1)
Leukocytes, Bld: 5.7 10*3/uL (ref 4.0–12.0)
MCH: 28.4 pg (ref 27.0–33.0)
MCHC: 33.9 g/dL (ref 30.0–36.0)
MCV: 83.7 fL (ref 80.0–100.0)
MPV: 9.1 fL (ref 9.0–13.0)
Platelets: 225 10*3/uL (ref 140–400)
RBC: 5.21 10*6/uL (ref 4.20–5.80)
RDW: 11.7 % (ref 11.0–15.0)

## 2023-12-21 LAB — WHITE BLOOD COUNT AND DIFFERENTIAL
Basophils %: 0.4 %
Basophils Absolute: 0 10*3/uL (ref 0.0–0.2)
Eosinophils %: 1.9 %
Eosinophils Absolute: 0.1 10*3/uL (ref 0.0–0.5)
Granulocyte Absolute Count: 2.3 10*3/uL (ref 1.5–7.8)
Immature Granulocytes %: 0 10*3/uL (ref 0.0–0.1)
Immature Granulocytes %: 0.4 % (ref 0.0–2.0)
Lymphocytes %: 46.5 %
Lymphocytes Absolute: 2.6 10*3/uL (ref 0.8–3.9)
Monocytes %: 10.1 %
Monocytes Absolute: 0.6 10*3/uL (ref 0.2–0.9)
Neutrophils %: 40.7 %

## 2023-12-21 LAB — LIPID PANEL
Cholesterol non HDL: 155 mg/dL — ABNORMAL HIGH (ref 0–129)
Cholesterol, Total: 182 mg/dL (ref 125–199)
HDL: 27 mg/dL — ABNORMAL LOW (ref 40–180)
LDL Cholesterol: 102 mg/dL — ABNORMAL HIGH (ref 0–100)
Triglycerides: 266 mg/dL — ABNORMAL HIGH (ref 0–149)

## 2023-12-21 LAB — COMPREHENSIVE METABOLIC PANEL
ALT: 15 U/L (ref 0–60)
AST: 19 U/L (ref 0–40)
Albumin/Globulin Ratio: 2 NA (ref 1.0–2.1)
Albumin: 4.7 g/dL (ref 3.5–5.0)
Alkaline Phosphatase: 56 U/L (ref 33–140)
Anion Gap: 7 mmol/L (ref 5–13)
BUN/Creatinine Ratio: 13 NA
BUN: 14 mg/dL (ref 7–25)
CO2: 26 mmol/L (ref 22–29)
Calcium: 9.5 mg/dL (ref 8.5–10.5)
Chloride: 105 mmol/L (ref 98–110)
Creatinine: 1.1 mg/dL (ref 0.50–1.30)
EGFR (CKD-EPI): 93 See Note
Globulin: 2.3 g/dL (ref 2.0–3.7)
Glucose: 94 mg/dL (ref 71–99)
Potassium: 4.4 mmol/L (ref 3.5–5.1)
Protein, Fluid: 7 g/dL (ref 6.0–8.0)
Sodium: 138 mmol/L (ref 135–146)
Total Bilirubin: 0.7 mg/dL (ref 0.2–1.2)

## 2023-12-21 LAB — VITAMIN D 25 HYDROXY: Calcidiol + Calciferol: 21 ng/mL — ABNORMAL LOW (ref 30–80)

## 2023-12-28 LAB — MTHFR FINAL REPORT

## 2024-03-25 ENCOUNTER — Inpatient Hospital Stay
Admit: 2024-03-25 | Discharge: 2024-03-25 | Disposition: A | Discharge status: Home / Self Care | Payer: BLUE CROSS/BLUE SHIELD

## 2024-03-25 DIAGNOSIS — R1114 Bilious vomiting: Secondary | ICD-10-CM

## 2024-03-25 DIAGNOSIS — E876 Hypokalemia: Secondary | ICD-10-CM

## 2024-03-25 LAB — MAGNESIUM: Magnesium: 1.9 mg/dL (ref 1.8–2.4)

## 2024-03-25 LAB — CBC WITH AUTO DIFFERENTIAL
Basophils %: 0 %
Basophils Absolute: 0.01 10*3/uL (ref 0.00–0.20)
Eosinophils %: 0 %
Eosinophils Absolute: 0.02 10*3/uL (ref 0.00–0.60)
Hematocrit: 44.3 % (ref 40.5–52.5)
Hemoglobin: 15.4 g/dL (ref 13.5–17.5)
Immature Granulocytes %: 0 %
Immature Granulocytes Absolute: 0.02 10*3/uL (ref 0.00–0.50)
Lymphocytes %: 8 %
Lymphocytes Absolute: 0.47 10*3/uL — ABNORMAL LOW (ref 1.00–5.10)
MCH: 28.1 pg (ref 26.0–34.0)
MCHC: 34.8 g/dL (ref 31.0–36.0)
MCV: 80.7 fL (ref 80.0–100.0)
MPV: 9.3 fL — ABNORMAL LOW (ref 9.4–12.4)
Monocytes %: 11 %
Monocytes Absolute: 0.63 10*3/uL (ref 0.00–1.30)
Neutrophils %: 81 %
Neutrophils Absolute: 4.85 10*3/uL (ref 1.70–7.70)
Platelets: 194 10*3/uL (ref 135–450)
RBC: 5.49 m/uL (ref 4.20–5.90)
RDW: 11.9 % — ABNORMAL LOW (ref 12.4–15.4)
WBC: 6 10*3/uL (ref 4.0–11.0)

## 2024-03-25 LAB — COMPREHENSIVE METABOLIC PANEL
ALT: 19 U/L (ref 10–40)
AST: 30 U/L (ref 15–37)
Albumin/Globulin Ratio: 2.1
Albumin: 5 g/dL (ref 3.4–5.0)
Alkaline Phosphatase: 54 U/L (ref 40–129)
Anion Gap: 15 mmol/L (ref 3–16)
BUN: 13 mg/dL (ref 7–20)
CO2: 22 mmol/L (ref 21–32)
Calcium: 8.8 mg/dL (ref 8.3–10.6)
Chloride: 103 mmol/L (ref 99–110)
Creatinine: 1 mg/dL (ref 0.7–1.8)
Est, Glom Filt Rate: >90 mL/min/{1.73_m2} (ref >60)
Glucose: 115 mg/dL — ABNORMAL HIGH (ref 70–99)
Potassium: 3.4 mmol/L — ABNORMAL LOW (ref 3.5–5.1)
Sodium: 140 mmol/L (ref 136–145)
Total Bilirubin: 0.8 mg/dL (ref 0.0–1.0)
Total Protein: 7.3 g/dL (ref 6.4–8.2)

## 2024-03-25 LAB — LIPASE: Lipase: 18 U/L (ref 13–60)

## 2024-03-25 MED ORDER — DIAZEPAM 5 MG/ML IJ SOLN
5 | Freq: Once | INTRAMUSCULAR | Status: DC
Start: 2024-03-25 — End: 2024-03-25

## 2024-03-25 MED ORDER — POTASSIUM CHLORIDE CRYS ER 20 MEQ PO TBCR
20 | Freq: Once | ORAL | Status: AC
Start: 2024-03-25 — End: 2024-03-25
  Administered 2024-03-25: 20:00:00 40 meq via ORAL

## 2024-03-25 MED ORDER — ONDANSETRON HCL 4 MG/2ML IJ SOLN
4 | Freq: Once | INTRAMUSCULAR | Status: AC
Start: 2024-03-25 — End: 2024-03-25
  Administered 2024-03-25: 20:00:00 4 mg via INTRAVENOUS

## 2024-03-25 MED ORDER — PANTOPRAZOLE SODIUM 40 MG IV SOLR
40 | Freq: Once | INTRAVENOUS | Status: AC
Start: 2024-03-25 — End: 2024-03-25
  Administered 2024-03-25: 20:00:00 40 mg via INTRAVENOUS

## 2024-03-25 MED ORDER — SODIUM CHLORIDE 0.9 % IV BOLUS
0.9 | Freq: Once | INTRAVENOUS | Status: AC
Start: 2024-03-25 — End: 2024-03-25
  Administered 2024-03-25: 20:00:00 500 mL via INTRAVENOUS

## 2024-03-25 NOTE — ED Notes (Signed)
 Md Cornelia Dieter into see pt and discuss discharge w follow up care plan. Pt agreeable to plan.

## 2024-03-25 NOTE — ED Notes (Signed)
 Pt states he feels much better now. Po challenge done. Tolerated well.

## 2024-03-25 NOTE — ED Provider Notes (Signed)
 EMERGENCY MEDICINE PROVIDER NOTE    Patient Identification  Pt Name: Benjamin Mckee  MRN: [id number]  Birthdate January 14, 1996  Date of evaluation: 03/25/2024  Provider: Anselm Kingfisher, DO  PCP: Rozelle Corning, DO    Chief Complaint  Vomiting (Pt presents to the ED with complaints of vomiting, chills, and fatigue since yesterday afternoon. )      HPI  (History provided by patient)  This is a 28 y.o. male who was brought in by self for nausea, vomiting and chills.  He is also having some weakness along with occasional bodyaches.  The symptoms started yesterday afternoon.  Patient denies any abdominal pain right now but he states that after he vomits he does have some abdominal pain but this resolves on its own.  When the abdominal pain is occurring it is sharp in nature.  Nothing makes it better or worse.  He denies any diarrhea or constipation.  The abdominal pain does not radiate.    I have reviewed the following nursing documentation:  Allergies: Patient has no known allergies.    Past medical history: History reviewed. No pertinent past medical history.  Past surgical history: History reviewed. No pertinent surgical history.    Home medications:   Discharge Medication List as of 03/25/2024  4:51 PM        CONTINUE these medications which have NOT CHANGED    Details   vitamin D 50 MCG (2000 UT) CAPS capsule Take 1 capsule by mouth dailyHistorical Med             Social history:  reports that he has never smoked. He has never used smokeless tobacco. He reports current alcohol use. He reports that he does not currently use drugs.    Family history:    Family History   Problem Relation Age of Onset    Diabetes Brother        Exam  ED Triage Vitals [03/25/24 1501]   BP Systolic BP Percentile Diastolic BP Percentile Temp Temp Source Pulse Respirations SpO2   (!) 147/84 -- -- 98.6 F (37 C) Oral 88 16 91 %      Height Weight - Scale         1.956 m (6\' 5" ) 116.4 kg (256 lb 11.2 oz)           Nursing note and vitals  reviewed.  General: Patient does not appear to be in acute distress  Head: Atraumatic.   ENT: No evidence of angioedema.   Eyes: Anicteric sclera. No discharge.   Neck: Supple. Trachea midline.   Cardiovascular: RRR; heart has a regular rhythm and rate  Pulmonary/Chest: Effort normal. No respiratory distress..  Lungs are clear bilaterally  Abdominal: Soft. No distension.  Abdomen is soft and nondistended.  Patient does not have any pain with palpation of the abdomen.  Good bowel sounds on exam.  Neurological: Alert and oriented. Face symmetric. Speech is clear.  Skin: Warm and dry. No rash.    Procedures          Radiology  No orders to display       Labs  Results for orders placed or performed during the hospital encounter of 03/25/24   Comprehensive Metabolic Panel   Result Value Ref Range    Sodium 140 136 - 145 mmol/L    Potassium 3.4 (L) 3.5 - 5.1 mmol/L    Chloride 103 99 - 110 mmol/L    CO2 22 21 - 32 mmol/L    Anion  Gap 15 3 - 16 mmol/L    Glucose 115 (H) 70 - 99 mg/dL    BUN 13 7 - 20 mg/dL    Creatinine 1.0 0.7 - 1.8 mg/dL    Est, Glom Filt Rate >90 >60 mL/min/1.74m2    Calcium 8.8 8.3 - 10.6 mg/dL    Total Protein 7.3 6.4 - 8.2 g/dL    Albumin 5.0 3.4 - 5.0 g/dL    Albumin/Globulin Ratio 2.1     Total Bilirubin 0.8 0.0 - 1.0 mg/dL    Alkaline Phosphatase 54 40 - 129 U/L    ALT 19 10 - 40 U/L    AST 30 15 - 37 U/L   Lipase   Result Value Ref Range    Lipase 18 13 - 60 U/L   CBC with Auto Differential   Result Value Ref Range    WBC 6.0 4.0 - 11.0 k/uL    RBC 5.49 4.20 - 5.90 m/uL    Hemoglobin 15.4 13.5 - 17.5 g/dL    Hematocrit 54.0 98.1 - 52.5 %    MCV 80.7 80.0 - 100.0 fL    MCH 28.1 26.0 - 34.0 pg    MCHC 34.8 31.0 - 36.0 g/dL    RDW 19.1 (L) 47.8 - 15.4 %    Platelets 194 135 - 450 k/uL    MPV 9.3 (L) 9.4 - 12.4 fL    Neutrophils % 81 %    Lymphocytes % 8 %    Monocytes % 11 %    Eosinophils % 0 %    Basophils % 0 %    Immature Granulocytes % 0 0 %    Neutrophils Absolute 4.85 1.70 - 7.70 k/uL     Lymphocytes Absolute 0.47 (L) 1.00 - 5.10 k/uL    Monocytes Absolute 0.63 0.00 - 1.30 k/uL    Eosinophils Absolute 0.02 0.00 - 0.60 k/uL    Basophils Absolute 0.01 0.00 - 0.20 k/uL    Immature Granulocytes Absolute 0.02 0.00 - 0.50 k/uL   Magnesium   Result Value Ref Range    Magnesium 1.9 1.8 - 2.4 mg/dL       SEP-1  Is this patient to be included in the SEP-1 Core Measure due to severe sepsis or septic shock?   no    Screenings  NIH Stroke Scale  NIH Stroke Scale Assessed: No                 Medications Given During ED Visit  Medications   sodium chloride 0.9 % bolus 500 mL (0 mLs IntraVENous Stopped 03/25/24 1625)   ondansetron (ZOFRAN) injection 4 mg (4 mg IntraVENous Given 03/25/24 1548)   pantoprazole (PROTONIX) injection 40 mg (40 mg IntraVENous Given 03/25/24 1548)   potassium chloride (KLOR-CON M) extended release tablet 40 mEq (40 mEq Oral Given 03/25/24 1624)       Records Reviewed  I reviewed the patient's previous medical records.    Social Determinants of Health  None    Chronic Conditions affecting care   has no past medical history on file.    MDM and ED Course  Patient was given fluids, Zofran and Protonix.  Patient's potassium was found to be mildly low at 3.4.  I do not believe imaging is necessary at this time.  He is feeling much better after the medication.  He is hemodynamically stable.  He does not have an acute abdomen clinically.  Patient is nontoxic and nonseptic in appearance.  This may  be viral in nature.  Patient was given potassium replacement.  He is stable for discharge and encouraged to return if symptoms worsen or change.  Patient understands the plan.      Critical Care Time: 0 Minutes  This excludes separately billable procedures.      Final Impression  1. Bilious vomiting with nausea    2. Hypokalemia        Blood pressure 132/79, pulse 88, temperature 98.6 F (37 C), temperature source Oral, resp. rate 16, height 1.956 m (6\' 5" ), weight 116.4 kg (256 lb 11.2 oz), SpO2 98%.      Disposition:  DISPOSITION Decision To Discharge 03/25/2024 04:48:48 PM   DISPOSITION CONDITION Stable           Patient Referrals:  Rozelle Corning, DO  [address]  [place] [address]  [phone]      As needed      Discharge Medications:  Discharge Medication List as of 03/25/2024  4:51 PM          This chart was generated using the Dragon dictation system. I created this record but it may contain dictation errors given the limitations of this technology.        Anselm Kingfisher, DO  03/26/24 [phone]

## 2024-11-01 NOTE — HIE Documentation (Unsigned)
 "  SUBJECTIVE    Benjamin Mckee is a 28 y.o. male who came into the clinic today with concerns   of  forgetfulness.  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  The patient is concerned about his   brain health and would like to be referred to a neurologist for further   workup  and recommendations.  The patient denies   any frequent headaches, dizziness, confusion, tinnitus, numbness, tingling or  weakness anywhere in the body.  Denies   any anxiety, depression or mood swings.  No other questions or concerns today  and all the questions and concerns   were appropriately answered    Past Medical History  No past medical history on file.    Problem List  There is no problem list on file for this patient.    Past Surgical History  No past surgical history on file.    Family History  Family History  Problem Relation Name Age of Onset   Thyroid Disease Paternal Grandfather   Other Neg Hx       cardiovascular    Social History    Tobacco Use   Smoking status: Never   Smokeless tobacco: Never  Substance Use Topics   Alcohol use: Yes      Medications Ordered Prior to Encounter  No current outpatient medications on file prior to visit.    No current facility-administered medications on file prior to visit.    Allergies  No Known Allergies    REVIEW OF SYSTEMS:  CONSTITUTIONAL: No weight loss, fever, chills, weakness, or fatigue.  HEENT: Eyes: No visual loss, blurred vision, double vision or yellow sclerae.  Ears, Nose, Throat: No hearing loss, sneezing, congestion, runny nose, or sore  throat.  SKIN: No rash or itching.  CARDIOVASCULAR: No chest pain, chest pressure or chest discomfort. No  palpitations or edema.  RESPIRATORY: No shortness of breath, cough, or sputum.  GASTROINTESTINAL: No anorexia, nausea, vomiting, diarrhea, or constipation. No  abdominal pain, hematochezia, or melena.  GENITOURINARY: No dysuria, urgency, frequency, hematuria.  NEUROLOGICAL: No  headache, dizziness, syncope, paralysis, ataxia, numbness or  tingling  in the extremities. No change in bowel or bladder control. Complains of  forgetfulness.  MUSCULOSKELETAL: No muscle pain, back pain, joint pain, or stiffness.  PSYCHIATRIC: No history of depression or anxiety.  ENDOCRINOLOGIC: No reports of sweating, cold or heat intolerance. No polyuria   or  polydipsia.    OBJECTIVE    BP 120/80   Pulse (!) 106   Temp (!) 96.6 ???F (35.9 ???C) (Temporal)   Ht   6' 5  (1.956 m)   Wt 267 lb 6.4 oz (121.3 kg)   SpO2 98%   BMI 31.71 kg/m???    GENERAL: Benjamin Mckee is a 28 y.o. male who is not in any acute distress. He  was well attired and well-groomed and was speaking in full sentences.    LUNGS: Normal ventilatory breath sounds are heard bilaterally. No crackles or  wheezes heard.    CARDIOVASCULAR: Normal S1, S2 heard. No murmurs heard. No JVD.    EXTREMITIES: All 4 extremities were moving fine. Full range of motion is  present. No deformity noted.  Peripheral pulses were felt. No lower extremity edema. Neurovascular integrity  maintained.    NEUROLOGICAL: The patient was alert, conscious, and cooperative. Oriented to  time, place, and person. Muscle power in all 4 limbs is  5/5. Cranial  nerves II thru XII are grossly intact. No sensory or motor loss.      ASSESSMENT / PLAN    Diagnoses and all orders for this visit:    Forgetfulness  -     AMB REFERRAL TO NEUROLOGY    Personal history of multiple concussions  -     AMB REFERRAL TO NEUROLOGY    Advise given:  Drink plenty of fluids.  Eat five servings of fruits and vegetables every day.  Discussed importance of regular exercise and recommended starting or   continuing  a regular exercise program for good health.  Try to lose weight with diet and exercise as discussed.  The importance of annual eye exams was reviewed.  The importance of proper foot care and regularly checking feet to prevent   sores  and possibly loss of limbs was  reviewed.  Appropriate handouts were given to the patient.  All the questions and concerns were appropriately answered.  Patient/ family member I caregiver verbalized understanding of patient  instructions from today's visit.  The patient was advised to follow up with me in 1 year for recheck or can call  me before if he has any other questions or concerns.  "
